# Patient Record
Sex: Female | Born: 1965 | Race: White | Hispanic: No | Smoking: Never smoker
Health system: Southern US, Community
[De-identification: ages and names within clinical notes are randomized; demographics above are authoritative.]

## PROBLEM LIST (undated history)

## (undated) DIAGNOSIS — D649 Anemia, unspecified: Secondary | ICD-10-CM

## (undated) DIAGNOSIS — N938 Other specified abnormal uterine and vaginal bleeding: Secondary | ICD-10-CM

## (undated) HISTORY — DX: Other specified abnormal uterine and vaginal bleeding: N93.8

## (undated) HISTORY — DX: Anemia, unspecified: D64.9

---

## 2013-01-16 ENCOUNTER — Other Ambulatory Visit: Payer: BC Managed Care – PPO

## 2013-01-16 DIAGNOSIS — Z0000 Encounter for general adult medical examination without abnormal findings: Secondary | ICD-10-CM

## 2013-01-16 LAB — LIPID PANEL
LDL Cholesterol: 73 mg/dL (ref 0–99)
Triglycerides: 73 mg/dL (ref <150)

## 2013-01-17 ENCOUNTER — Encounter: Payer: Self-pay | Admitting: Family Medicine

## 2013-01-26 ENCOUNTER — Ambulatory Visit (INDEPENDENT_AMBULATORY_CARE_PROVIDER_SITE_OTHER): Payer: BC Managed Care – PPO | Admitting: Family Medicine

## 2013-01-26 ENCOUNTER — Encounter: Payer: Self-pay | Admitting: Family Medicine

## 2013-01-26 VITALS — BP 100/64 | HR 78 | Temp 98.4°F | Resp 16 | Ht 62.0 in | Wt 116.0 lb

## 2013-01-26 DIAGNOSIS — R5383 Other fatigue: Secondary | ICD-10-CM

## 2013-01-26 DIAGNOSIS — R5381 Other malaise: Secondary | ICD-10-CM

## 2013-01-26 DIAGNOSIS — Z13220 Encounter for screening for lipoid disorders: Secondary | ICD-10-CM

## 2013-01-26 DIAGNOSIS — N938 Other specified abnormal uterine and vaginal bleeding: Secondary | ICD-10-CM | POA: Insufficient documentation

## 2013-01-26 DIAGNOSIS — D649 Anemia, unspecified: Secondary | ICD-10-CM | POA: Insufficient documentation

## 2013-01-26 NOTE — Progress Notes (Signed)
Subjective:    Patient ID: Mary Cross, female    DOB: 06/13/1966, 47 y.o.   MRN: [id number]  HPI Patient is here today for cholesterol screening. She denies chest pain shortness of breath or dyspnea on exertion. She needs a cholesterol screening for a form for work. She also complains of some mild fatigue and she like to have her sugar checked. She just drank a beverage was sugar in the beverage within the last hour.   Past Medical History  Diagnosis Date  . Anemia   . DUB (dysfunctional uterine bleeding)    No current outpatient prescriptions on file prior to visit.   No current facility-administered medications on file prior to visit.   No Known Allergies History   Social History  . Marital Status: Married    Spouse Name: N/A    Number of Children: N/A  . Years of Education: N/A   Occupational History  . Not on file.   Social History Main Topics  . Smoking status: Never Smoker   . Smokeless tobacco: Never Used  . Alcohol Use: No  . Drug Use: No  . Sexually Active: Not on file   Other Topics Concern  . Not on file   Social History Narrative  . No narrative on file   she is currently going through a divorce. This is causing a great deal of stress in her life. She is requesting copies of her last office visits for her court proceedings.  I gave the patient copies of her last 3 office visit.    Review of Systems  All other systems reviewed and are negative.       Objective:   Physical Exam  Vitals reviewed. Constitutional: She is oriented to person, place, and time. She appears well-developed and well-nourished. No distress.  HENT:  Head: Normocephalic and atraumatic.  Eyes: Conjunctivae are normal. Pupils are equal, round, and reactive to light. No scleral icterus.  Cardiovascular: Normal rate and regular rhythm.   Pulmonary/Chest: Effort normal and breath sounds normal.  Neurological: She is alert and oriented to person, place, and time.  Skin: She is  not diaphoretic.  Psychiatric: She has a normal mood and affect. Her behavior is normal. Judgment and thought content normal.          Assessment & Plan:  1. Other malaise and fatigue Random blood sugar is 111 which is normal given the fact she just ate. I reassured her that her sugar is normal. - Glucose, fingerstick (stat)  2. Screening for cholesterol level Cholesterol panel as listed below and her readings are excellent. No therapy is necessary. Lab on 01/16/2013  Component Date Value Range Status  . Cholesterol 01/16/2013 163  0 - 200 mg/dL Final   Comment: ATP III Classification:                                < 200        mg/dL        Desirable                               200 - 239     mg/dL        Borderline High                               >=  240        mg/dL        High                             . Triglycerides 01/16/2013 73  <150 mg/dL Final  . HDL 09/32/3557 75  >39 mg/dL Final  . Total CHOL/HDL Ratio 01/16/2013 2.2   Final  . VLDL 01/16/2013 15  0 - 40 mg/dL Final  . LDL Cholesterol 01/16/2013 73  0 - 99 mg/dL Final   Comment:                            Total Cholesterol/HDL Ratio:CHD Risk                                                 Coronary Heart Disease Risk Table                                                                 Men       Women                                   1/2 Average Risk              3.4        3.3                                       Average Risk              5.0        4.4                                    2X Average Risk              9.6        7.1                                    3X Average Risk             23.4       11.0                          Use the calculated Patient Ratio above and the CHD Risk table                           to determine the patient's CHD Risk.                          ATP III Classification (LDL):                                <  100        mg/dL         Optimal                               100  - 129     mg/dL         Near or Above Optimal                               130 - 159     mg/dL         Borderline High                               160 - 189     mg/dL         High                                > 190        mg/dL         Very High

## 2013-02-14 LAB — GLUCOSE, FINGERSTICK (STAT): Glucose, fingerstick: 111 mg/dL — ABNORMAL HIGH (ref 70–99)

## 2013-03-14 ENCOUNTER — Telehealth: Payer: Self-pay | Admitting: Family Medicine

## 2013-03-14 NOTE — Telephone Encounter (Signed)
Ortho cyclen 1/35 4 on day 1, 3 on day 2, 2 on day 3, 1 on day 4  Pt last got this 11/19/11.Mary Kitchenshe is going through a divorce and child custody and she is very stressed. Last time she got this she had just gotten separated and was under a lot of stress, she is soaking through pads and tampons, she is very tired because of bleeding so much, she said that this only happens when she is stressed

## 2013-03-15 NOTE — Telephone Encounter (Signed)
She needs to stay on  1 pill a day afterwards to prevent this.  Please warn about nausea and the risk of DVT from high dose estrogen.

## 2013-03-15 NOTE — Telephone Encounter (Signed)
ok 

## 2013-03-15 NOTE — Telephone Encounter (Signed)
LMTRC

## 2013-03-15 NOTE — Telephone Encounter (Signed)
?   OK to Refill  

## 2013-04-05 NOTE — Telephone Encounter (Signed)
No return call 

## 2013-09-03 ENCOUNTER — Encounter: Payer: BC Managed Care – PPO | Admitting: Family Medicine

## 2013-09-03 ENCOUNTER — Encounter: Payer: Self-pay | Admitting: Family Medicine

## 2013-09-03 NOTE — Progress Notes (Signed)
  This encounter was created in error - please disregard. Patient wanted to discuss her child's pregnancy. She wants an abortion. I recommended that they contact [facility] for further information.  Be glad to schedule a GYN/OB appointment if they so desire

## 2014-01-11 ENCOUNTER — Other Ambulatory Visit: Payer: Self-pay | Admitting: Family Medicine

## 2014-01-11 ENCOUNTER — Other Ambulatory Visit: Payer: BC Managed Care – PPO

## 2014-01-11 DIAGNOSIS — E785 Hyperlipidemia, unspecified: Secondary | ICD-10-CM

## 2014-01-11 DIAGNOSIS — D6489 Other specified anemias: Secondary | ICD-10-CM

## 2014-01-11 LAB — COMPLETE METABOLIC PANEL WITH GFR
ALBUMIN: 3.7 g/dL (ref 3.5–5.2)
ALT: 14 U/L (ref 0–35)
AST: 17 U/L (ref 0–37)
Alkaline Phosphatase: 41 U/L (ref 39–117)
BUN: 11 mg/dL (ref 6–23)
CALCIUM: 8.8 mg/dL (ref 8.4–10.5)
CHLORIDE: 108 meq/L (ref 96–112)
CO2: 24 meq/L (ref 19–32)
Creat: 0.66 mg/dL (ref 0.50–1.10)
GLUCOSE: 80 mg/dL (ref 70–99)
POTASSIUM: 4.5 meq/L (ref 3.5–5.3)
SODIUM: 140 meq/L (ref 135–145)
TOTAL PROTEIN: 6.1 g/dL (ref 6.0–8.3)
Total Bilirubin: 0.3 mg/dL (ref 0.2–1.2)

## 2014-01-11 LAB — CBC WITH DIFFERENTIAL/PLATELET
Basophils Absolute: 0.1 10*3/uL (ref 0.0–0.1)
Basophils Relative: 1 % (ref 0–1)
EOS ABS: 0.1 10*3/uL (ref 0.0–0.7)
Eosinophils Relative: 1 % (ref 0–5)
HCT: 27.8 % — ABNORMAL LOW (ref 36.0–46.0)
HEMOGLOBIN: 8.5 g/dL — AB (ref 12.0–15.0)
LYMPHS ABS: 1.8 10*3/uL (ref 0.7–4.0)
LYMPHS PCT: 21 % (ref 12–46)
MCH: 21.6 pg — AB (ref 26.0–34.0)
MCHC: 30.2 g/dL (ref 30.0–36.0)
MCV: 70.6 fL — ABNORMAL LOW (ref 78.0–100.0)
MONOS PCT: 9 % (ref 3–12)
Monocytes Absolute: 0.8 10*3/uL (ref 0.1–1.0)
NEUTROS ABS: 5.7 10*3/uL (ref 1.7–7.7)
NEUTROS PCT: 68 % (ref 43–77)
PLATELETS: 392 10*3/uL (ref 150–400)
RBC: 3.94 MIL/uL (ref 3.87–5.11)
RDW: 18.8 % — ABNORMAL HIGH (ref 11.5–15.5)
WBC: 8.4 10*3/uL (ref 4.0–10.5)

## 2014-01-11 LAB — LIPID PANEL
CHOLESTEROL: 123 mg/dL (ref 0–200)
HDL: 62 mg/dL (ref >39)
LDL Cholesterol: 53 mg/dL (ref 0–99)
Total CHOL/HDL Ratio: 2 Ratio
Triglycerides: 40 mg/dL (ref <150)
VLDL: 8 mg/dL (ref 0–40)

## 2014-01-14 LAB — IRON

## 2014-01-14 LAB — VITAMIN B12: VITAMIN B 12: 458 pg/mL (ref 211–911)

## 2014-01-14 LAB — FERRITIN: FERRITIN: 2 ng/mL — AB (ref 10–291)

## 2014-01-14 LAB — FOLATE: FOLATE: 9 ng/mL

## 2014-01-16 ENCOUNTER — Other Ambulatory Visit: Payer: BC Managed Care – PPO

## 2014-01-16 DIAGNOSIS — D649 Anemia, unspecified: Secondary | ICD-10-CM

## 2014-01-17 LAB — FECAL OCCULT BLOOD, IMMUNOCHEMICAL
FECAL OCCULT BLOOD: NEGATIVE
Fecal Occult Blood: NEGATIVE
Fecal Occult Blood: NEGATIVE

## 2014-01-18 ENCOUNTER — Encounter: Payer: Self-pay | Admitting: Family Medicine

## 2014-01-18 ENCOUNTER — Ambulatory Visit (INDEPENDENT_AMBULATORY_CARE_PROVIDER_SITE_OTHER): Payer: BC Managed Care – PPO | Admitting: Family Medicine

## 2014-01-18 VITALS — BP 118/64 | HR 94 | Temp 99.2°F | Resp 14 | Ht 62.0 in | Wt 123.0 lb

## 2014-01-18 DIAGNOSIS — D509 Iron deficiency anemia, unspecified: Secondary | ICD-10-CM

## 2014-01-18 NOTE — Progress Notes (Signed)
Subjective:    Patient ID: Mary Cross, female    DOB: [birth year], 48 y.o.   MRN: [id number]  HPI Patient been complaining of fatigue recently. Her most recent lab work revealed severe anemia with a hemoglobin of 8.5. She was found to have extremely low iron and ferritin. Her folate and B12 are normal. Her stool cards were negative for blood x3. Patient has a history of dysfunctional uterine bleeding. She has only had 3 episodes of severe menorrhagia.  However she does have a heavy period every month. It typically last 5 days. She soaks through a heavy pad every 4 hours. She is unwilling to take birth control to regulate her menstrual cycles. She is also not been taking any kind of iron due to her inability to tolerate oral iron. Most recent labwork as listed below: Lab on 01/16/2014  Component Date Value Ref Range Status  . Fecal Occult Blood 01/16/2014 NEG  Negative Final  . Fecal Occult Blood 01/16/2014 NEG  Negative Final  . Fecal Occult Blood 01/16/2014 NEG  Negative Final  Lab on 01/11/2014  Component Date Value Ref Range Status  . Cholesterol 01/11/2014 123  0 - 200 mg/dL Final   Comment: ATP III Classification:                                < 200        mg/dL        Desirable                               200 - 239     mg/dL        Borderline High                               >= 240        mg/dL        High                             . Triglycerides 01/11/2014 40  <150 mg/dL Final  . HDL 01/11/2014 62  >39 mg/dL Final  . Total CHOL/HDL Ratio 01/11/2014 2.0   Final  . VLDL 01/11/2014 8  0 - 40 mg/dL Final  . LDL Cholesterol 01/11/2014 53  0 - 99 mg/dL Final   Comment:                            Total Cholesterol/HDL Ratio:CHD Risk                                                 Coronary Heart Disease Risk Table                                                                 Men       Women  1/2 Average Risk              3.4        3.3                                    Average Risk              5.0        4.4                                    2X Average Risk              9.6        7.1                                    3X Average Risk             23.4       11.0                          Use the calculated Patient Ratio above and the CHD Risk table                           to determine the patient's CHD Risk.                          ATP III Classification (LDL):                                < 100        mg/dL         Optimal                               100 - 129     mg/dL         Near or Above Optimal                               130 - 159     mg/dL         Borderline High                               160 - 189     mg/dL         High                                > 190        mg/dL         Very High                             . WBC 01/11/2014 8.4  4.0 - 10.5 K/uL Final  . RBC 01/11/2014 3.94  3.87 - 5.11 MIL/uL Final  . Hemoglobin 01/11/2014 8.5* 12.0 -  15.0 g/dL Final   Result repeated and verified.  Marland Kitchen HCT 01/11/2014 27.8* 36.0 - 46.0 % Final  . MCV 01/11/2014 70.6* 78.0 - 100.0 fL Final  . MCH 01/11/2014 21.6* 26.0 - 34.0 pg Final  . MCHC 01/11/2014 30.2  30.0 - 36.0 g/dL Final  . RDW 01/11/2014 18.8* 11.5 - 15.5 % Final  . Platelets 01/11/2014 392  150 - 400 K/uL Final  . Neutrophils Relative % 01/11/2014 68  43 - 77 % Final  . Neutro Abs 01/11/2014 5.7  1.7 - 7.7 K/uL Final  . Lymphocytes Relative 01/11/2014 21  12 - 46 % Final  . Lymphs Abs 01/11/2014 1.8  0.7 - 4.0 K/uL Final  . Monocytes Relative 01/11/2014 9  3 - 12 % Final  . Monocytes Absolute 01/11/2014 0.8  0.1 - 1.0 K/uL Final  . Eosinophils Relative 01/11/2014 1  0 - 5 % Final  . Eosinophils Absolute 01/11/2014 0.1  0.0 - 0.7 K/uL Final  . Basophils Relative 01/11/2014 1  0 - 1 % Final  . Basophils Absolute 01/11/2014 0.1  0.0 - 0.1 K/uL Final  . Smear Review 01/11/2014 Criteria for review not met   Final  . Sodium 01/11/2014 140  135 - 145  mEq/L Final  . Potassium 01/11/2014 4.5  3.5 - 5.3 mEq/L Final  . Chloride 01/11/2014 108  96 - 112 mEq/L Final  . CO2 01/11/2014 24  19 - 32 mEq/L Final  . Glucose, Bld 01/11/2014 80  70 - 99 mg/dL Final  . BUN 01/11/2014 11  6 - 23 mg/dL Final  . Creat 01/11/2014 0.66  0.50 - 1.10 mg/dL Final  . Total Bilirubin 01/11/2014 0.3  0.2 - 1.2 mg/dL Final  . Alkaline Phosphatase 01/11/2014 41  39 - 117 U/L Final  . AST 01/11/2014 17  0 - 37 U/L Final  . ALT 01/11/2014 14  0 - 35 U/L Final  . Total Protein 01/11/2014 6.1  6.0 - 8.3 g/dL Final  . Albumin 01/11/2014 3.7  3.5 - 5.2 g/dL Final  . Calcium 01/11/2014 8.8  8.4 - 10.5 mg/dL Final  . GFR, Est African American 01/11/2014 >89   Final  . GFR, Est Non African American 01/11/2014 >89   Final   Comment:                            The estimated GFR is a calculation valid for adults (>=23 years old)                          that uses the CKD-EPI algorithm to adjust for age and sex. It is                            not to be used for children, pregnant women, hospitalized patients,                             patients on dialysis, or with rapidly changing kidney function.                          According to the NKDEP, eGFR >89 is normal, 60-89 shows mild  impairment, 30-59 shows moderate impairment, 15-29 shows severe                          impairment and <15 is ESRD.                             Orders Only on 01/11/2014  Component Date Value Ref Range Status  . Iron 01/11/2014 <10* 42 - 145 ug/dL Final  . Vitamin B-12 01/11/2014 458  211 - 911 pg/mL Final  . Folate 01/11/2014 9.0   Final   Comment:                            Reference Ranges                                  Deficient:       0.4 - 3.3 ng/mL                                  Indeterminate:   3.4 - 5.4 ng/mL                                  Normal:              > 5.4 ng/mL                             . Ferritin 01/11/2014 2* 10 - 291 ng/mL Final     Past Medical History  Diagnosis Date  . Anemia   . DUB (dysfunctional uterine bleeding)    No current outpatient prescriptions on file prior to visit.   No current facility-administered medications on file prior to visit.   No Known Allergies History   Social History  . Marital Status: Married    Spouse Name: N/A    Number of Children: N/A  . Years of Education: N/A   Occupational History  . Not on file.   Social History Main Topics  . Smoking status: Never Smoker   . Smokeless tobacco: Never Used  . Alcohol Use: No  . Drug Use: No  . Sexual Activity: Not on file   Other Topics Concern  . Not on file   Social History Narrative  . No narrative on file      Review of Systems  All other systems reviewed and are negative.      Objective:   Physical Exam  Vitals reviewed. Cardiovascular: Normal rate, regular rhythm and normal heart sounds.  Exam reveals no gallop and no friction rub.   No murmur heard. Pulmonary/Chest: Effort normal and breath sounds normal. No respiratory distress. She has no wheezes.  Abdominal: Soft. Bowel sounds are normal.          Assessment & Plan:  1. Anemia, iron deficiency I believe her anemia is due to iron deficiency Copper with menorrhagia. I have recommended Fusion Plus with 130 mg of elemental iron poqday and return in one month for a repeat CBC and iron level. Iron stores are not improving and her hemoglobin is not rising, I would recommend consultation with a hematologist regarding possible IV  iron. I also recommended referral to gynecologist discussed possible IUD placement to regulate her menorrhagia but the patient declined this.

## 2014-10-03 ENCOUNTER — Telehealth: Payer: Self-pay | Admitting: Family Medicine

## 2014-10-03 MED ORDER — NORGESTIMATE-ETH ESTRADIOL 0.25-35 MG-MCG PO TABS: 1.0000 | ORAL_TABLET | Freq: Every day | ORAL | Status: DC

## 2014-10-03 NOTE — Telephone Encounter (Signed)
Per [facility] will give 1 month of BCP's but pt has to have an appt with GYN  - pt states that she will call her dtrs gyn and set up appt to see him and she will come in to have BW done as well. 1 month of BPC sent to pharm

## 2014-10-03 NOTE — Telephone Encounter (Signed)
Please see last ov (7/15).  Patient overdue to recheck CBC on iron.  I recommend GYN referral.  This has gone on too long and given her sporadic follow up, I feel they need to perform endometrial biopsy and US to rule out cancer.

## 2014-10-03 NOTE — Telephone Encounter (Signed)
[facility]  Patient is calling to say that she is having very bad periods and has discussed this with dr Tanya Nonespickard, would like a call back as to what she needs to do or what she can take  [phone]

## 2014-10-17 ENCOUNTER — Other Ambulatory Visit: Payer: BLUE CROSS/BLUE SHIELD

## 2014-10-17 ENCOUNTER — Ambulatory Visit: Payer: BLUE CROSS/BLUE SHIELD | Admitting: *Deleted

## 2014-10-17 VITALS — BP 100/70

## 2014-10-17 DIAGNOSIS — D509 Iron deficiency anemia, unspecified: Secondary | ICD-10-CM

## 2014-10-17 DIAGNOSIS — Z0130 Encounter for examination of blood pressure without abnormal findings: Secondary | ICD-10-CM

## 2014-10-18 ENCOUNTER — Telehealth: Payer: Self-pay | Admitting: *Deleted

## 2014-10-18 LAB — CBC WITH DIFFERENTIAL/PLATELET
BASOS PCT: 0 % (ref 0–1)
Basophils Absolute: 0 10*3/uL (ref 0.0–0.1)
Eosinophils Absolute: 0.1 10*3/uL (ref 0.0–0.7)
Eosinophils Relative: 1 % (ref 0–5)
HCT: 27.9 % — ABNORMAL LOW (ref 36.0–46.0)
HEMOGLOBIN: 8.4 g/dL — AB (ref 12.0–15.0)
Lymphocytes Relative: 15 % (ref 12–46)
Lymphs Abs: 2.1 10*3/uL (ref 0.7–4.0)
MCH: 22.4 pg — ABNORMAL LOW (ref 26.0–34.0)
MCHC: 30.1 g/dL (ref 30.0–36.0)
MCV: 74.4 fL — ABNORMAL LOW (ref 78.0–100.0)
MONO ABS: 1.4 10*3/uL — AB (ref 0.1–1.0)
MONOS PCT: 10 % (ref 3–12)
MPV: 8.7 fL (ref 8.6–12.4)
NEUTROS ABS: 10.4 10*3/uL — AB (ref 1.7–7.7)
Neutrophils Relative %: 74 % (ref 43–77)
Platelets: 481 10*3/uL — ABNORMAL HIGH (ref 150–400)
RBC: 3.75 MIL/uL — AB (ref 3.87–5.11)
RDW: 17.8 % — ABNORMAL HIGH (ref 11.5–15.5)
WBC: 14 10*3/uL — ABNORMAL HIGH (ref 4.0–10.5)

## 2014-10-18 LAB — IBC PANEL
%SAT: 3 % — ABNORMAL LOW (ref 20–55)
TIBC: 486 ug/dL — AB (ref 250–470)
UIBC: 471 ug/dL — ABNORMAL HIGH (ref 125–400)

## 2014-10-18 LAB — FERRITIN: Ferritin: 8 ng/mL — ABNORMAL LOW (ref 10–291)

## 2014-10-18 LAB — IRON: IRON: 15 ug/dL — AB (ref 42–145)

## 2014-10-18 NOTE — Telephone Encounter (Signed)
Received a call from SchlusserAllison at [facility] with a critical lab of Hgb of 8.4 low, this was repeated and verified

## 2014-10-28 ENCOUNTER — Other Ambulatory Visit (HOSPITAL_COMMUNITY)
Admission: RE | Admit: 2014-10-28 | Discharge: 2014-10-28 | Disposition: A | Discharge status: Home / Self Care | Payer: BLUE CROSS/BLUE SHIELD | Source: Ambulatory Visit | Attending: Obstetrics & Gynecology | Admitting: Obstetrics & Gynecology

## 2014-10-28 ENCOUNTER — Other Ambulatory Visit: Payer: Self-pay | Admitting: Obstetrics & Gynecology

## 2014-10-28 DIAGNOSIS — Z1151 Encounter for screening for human papillomavirus (HPV): Secondary | ICD-10-CM | POA: Diagnosis present

## 2014-10-28 DIAGNOSIS — Z01419 Encounter for gynecological examination (general) (routine) without abnormal findings: Secondary | ICD-10-CM | POA: Diagnosis not present

## 2014-10-30 LAB — CYTOLOGY - PAP

## 2018-05-19 ENCOUNTER — Other Ambulatory Visit: Payer: Self-pay

## 2018-05-19 ENCOUNTER — Ambulatory Visit: Payer: BLUE CROSS/BLUE SHIELD | Admitting: Family Medicine

## 2018-05-19 ENCOUNTER — Ambulatory Visit (INDEPENDENT_AMBULATORY_CARE_PROVIDER_SITE_OTHER): Payer: BLUE CROSS/BLUE SHIELD | Admitting: Family Medicine

## 2018-05-19 ENCOUNTER — Encounter: Payer: Self-pay | Admitting: Family Medicine

## 2018-05-19 VITALS — BP 114/62 | HR 82 | Temp 98.4°F | Resp 14 | Ht 62.0 in | Wt 136.0 lb

## 2018-05-19 DIAGNOSIS — F418 Other specified anxiety disorders: Secondary | ICD-10-CM | POA: Diagnosis not present

## 2018-05-19 DIAGNOSIS — Z566 Other physical and mental strain related to work: Secondary | ICD-10-CM

## 2018-05-19 DIAGNOSIS — R079 Chest pain, unspecified: Secondary | ICD-10-CM

## 2018-05-19 NOTE — Patient Instructions (Signed)
F/u pending results 
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
## 2018-05-20 LAB — COMPREHENSIVE METABOLIC PANEL
AG Ratio: 2 (calc) (ref 1.0–2.5)
ALT: 15 U/L (ref 6–29)
AST: 17 U/L (ref 10–35)
Albumin: 4.7 g/dL (ref 3.6–5.1)
Alkaline phosphatase (APISO): 57 U/L (ref 33–130)
BUN: 12 mg/dL (ref 7–25)
CALCIUM: 10.2 mg/dL (ref 8.6–10.4)
CO2: 28 mmol/L (ref 20–32)
Chloride: 101 mmol/L (ref 98–110)
Creat: 0.9 mg/dL (ref 0.50–1.05)
GLUCOSE: 92 mg/dL (ref 65–99)
Globulin: 2.3 g/dL (calc) (ref 1.9–3.7)
Potassium: 3.6 mmol/L (ref 3.5–5.3)
SODIUM: 140 mmol/L (ref 135–146)
TOTAL PROTEIN: 7 g/dL (ref 6.1–8.1)
Total Bilirubin: 0.8 mg/dL (ref 0.2–1.2)

## 2018-05-20 LAB — CBC WITH DIFFERENTIAL/PLATELET
BASOS PCT: 0.4 %
Basophils Absolute: 53 cells/uL (ref 0–200)
Eosinophils Absolute: 145 cells/uL (ref 15–500)
Eosinophils Relative: 1.1 %
HCT: 42 % (ref 35.0–45.0)
Hemoglobin: 14.4 g/dL (ref 11.7–15.5)
Lymphs Abs: 2600 cells/uL (ref 850–3900)
MCH: 31.2 pg (ref 27.0–33.0)
MCHC: 34.3 g/dL (ref 32.0–36.0)
MCV: 90.9 fL (ref 80.0–100.0)
MPV: 9.2 fL (ref 7.5–12.5)
Monocytes Relative: 6.7 %
Neutro Abs: 9517 cells/uL — ABNORMAL HIGH (ref 1500–7800)
Neutrophils Relative %: 72.1 %
Platelets: 407 10*3/uL — ABNORMAL HIGH (ref 140–400)
RBC: 4.62 10*6/uL (ref 3.80–5.10)
RDW: 11.7 % (ref 11.0–15.0)
TOTAL LYMPHOCYTE: 19.7 %
WBC mixed population: 884 cells/uL (ref 200–950)
WBC: 13.2 10*3/uL — AB (ref 3.8–10.8)

## 2018-05-20 LAB — TSH: TSH: 2.08 mIU/L

## 2018-05-21 ENCOUNTER — Encounter: Payer: Self-pay | Admitting: Family Medicine

## 2018-06-08 ENCOUNTER — Encounter: Payer: Self-pay | Admitting: *Deleted

## 2018-06-08 ENCOUNTER — Other Ambulatory Visit: Payer: Self-pay | Admitting: *Deleted

## 2018-06-08 DIAGNOSIS — D72829 Elevated white blood cell count, unspecified: Secondary | ICD-10-CM

## 2018-06-13 ENCOUNTER — Telehealth: Payer: Self-pay | Admitting: *Deleted

## 2018-06-13 NOTE — Telephone Encounter (Signed)
I dont think I charged new pt though it had been > 3 years since she had been seen It was a long complex appointment with testing though

## 2018-06-13 NOTE — Telephone Encounter (Signed)
Received call from patient.   Reports that she believes she was overcharged for her appointment on 05/19/2018. States that she was billed as a new patient though she has been seen here previously.   Also states that she was recently terminated and cannot pay $350 for a new patient appointment.   Please advise.

## 2018-06-15 NOTE — Telephone Encounter (Signed)
I have looked under patients guarantor snapshot she was charged a level 5 office visit and the amount that is left over is the patients deductible. I have called and left patient a message to call me back.   CB# [phone]

## 2018-06-15 NOTE — Telephone Encounter (Signed)
Patient returned my call and I went over the charges with patient. She asked if I could please write off this bill I explained to patient that unfortunately I can't write it off since her insurance company is stating it is her responsibility and if I wrote it off they would consider that fraudulent. I advised patient that I would mark her account as on a payment plan so it wouldn't go to collections.   She did tell me about the stress she was dealing with from being fired at work, a recent divorce after 25 years and being a single mother to 4 children.

## 2019-07-31 ENCOUNTER — Telehealth (INDEPENDENT_AMBULATORY_CARE_PROVIDER_SITE_OTHER): Payer: BC Managed Care – PPO | Admitting: Family Medicine

## 2019-07-31 DIAGNOSIS — W5501XA Bitten by cat, initial encounter: Secondary | ICD-10-CM | POA: Diagnosis not present

## 2019-07-31 DIAGNOSIS — L03114 Cellulitis of left upper limb: Secondary | ICD-10-CM

## 2019-07-31 MED ORDER — FLUCONAZOLE 150 MG PO TABS: 150.0000 mg | ORAL_TABLET | Freq: Once | ORAL | 0 refills | Status: AC

## 2019-07-31 MED ORDER — AMOXICILLIN-POT CLAVULANATE 875-125 MG PO TABS: 1.0000 | ORAL_TABLET | Freq: Two times a day (BID) | ORAL | 0 refills | Status: DC

## 2019-07-31 NOTE — Progress Notes (Signed)
Subjective:    Patient ID: Mary Cross, female    DOB: [birth year], 54 y.o.   MRN: [id number]  HPI  Patient is being seen today as a video visit.  Patient consents to be seen as a video visit.  Phone call began at 410.  Phone call concluded at 419.  Patient states that yesterday she was bitten by her own cat on the left forearm.  Through the video screen I can see over the lateral dorsal left forearm that the skin in that area is erythematous and swollen.  She states that it is warm and tender to the touch.  She is not running a fever.  There is no streaking erythema however an area approximately 4 cm in diameter around the puncture wound is erythematous and warm and tender.  She appears to be developing a cellulitis after the cat bite.  It is also been more than 10 years since her last tetanus shot.  She denies any pain with range of motion of her fingers.  She is able to flex and extend her wrist without pain.  She is able to flex and extend her elbow without pain. Past Medical History:  Diagnosis Date  . Anemia   . DUB (dysfunctional uterine bleeding)    No past surgical history on file. Current Outpatient Medications on File Prior to Visit  Medication Sig Dispense Refill  . levonorgestrel (MIRENA) 20 MCG/24HR IUD 1 each by Intrauterine route once.     No current facility-administered medications on file prior to visit.   No Known Allergies Social History   Socioeconomic History  . Marital status: Divorced    Spouse name: Not on file  . Number of children: 4  . Years of education: Not on file  . Highest education level: High school graduate  Occupational History    Employer: [employer]  Tobacco Use  . Smoking status: Never Smoker  . Smokeless tobacco: Never Used  Substance and Sexual Activity  . Alcohol use: No    Comment: occ  . Drug use: No  . Sexual activity: Not Currently  Other Topics Concern  . Not on file  Social History Narrative  . Not on file   Social  Determinants of Health   Financial Resource Strain:   . Difficulty of Paying Living Expenses: Not on file  Food Insecurity:   . Worried About Charity fundraiser in the Last Year: Not on file  . Ran Out of Food in the Last Year: Not on file  Transportation Needs:   . Lack of Transportation (Medical): Not on file  . Lack of Transportation (Non-Medical): Not on file  Physical Activity:   . Days of Exercise per Week: Not on file  . Minutes of Exercise per Session: Not on file  Stress:   . Feeling of Stress : Not on file  Social Connections:   . Frequency of Communication with Friends and Family: Not on file  . Frequency of Social Gatherings with Friends and Family: Not on file  . Attends Religious Services: Not on file  . Active Member of Clubs or Organizations: Not on file  . Attends Archivist Meetings: Not on file  . Marital Status: Not on file  Intimate Partner Violence:   . Fear of Current or Ex-Partner: Not on file  . Emotionally Abused: Not on file  . Physically Abused: Not on file  . Sexually Abused: Not on file     Review of Systems  All other systems reviewed and are negative.      Objective:   Physical Exam Musculoskeletal:     Left forearm: Swelling and tenderness present.       Arms:           Assessment & Plan:  Cat bite, initial encounter  Cellulitis of left arm  I have recommended that the patient come in as soon as possible to get a tetanus shot.  Meanwhile start the patient on Augmentin 875 mg p.o. twice daily for 10 days to cover the cellulitis as well as Pasteurella multocida.  Recommended warm compresses be applied to the area to help with swelling and pain.  Gave the patient warning signs for compartment syndrome or secondary abscess.  Reassess in 48 hours if no better or sooner if worse.  Did give the patient a prescription for Diflucan in case she develops a secondary yeast infection.

## 2019-08-02 ENCOUNTER — Ambulatory Visit (INDEPENDENT_AMBULATORY_CARE_PROVIDER_SITE_OTHER): Payer: BC Managed Care – PPO | Admitting: *Deleted

## 2019-08-02 ENCOUNTER — Other Ambulatory Visit: Payer: Self-pay

## 2019-08-02 DIAGNOSIS — W5501XD Bitten by cat, subsequent encounter: Secondary | ICD-10-CM | POA: Diagnosis not present

## 2019-08-02 DIAGNOSIS — Z23 Encounter for immunization: Secondary | ICD-10-CM | POA: Diagnosis not present

## 2020-01-01 DIAGNOSIS — Z01419 Encounter for gynecological examination (general) (routine) without abnormal findings: Secondary | ICD-10-CM | POA: Diagnosis not present

## 2020-11-07 ENCOUNTER — Other Ambulatory Visit: Payer: Managed Care, Other (non HMO)

## 2020-11-07 ENCOUNTER — Other Ambulatory Visit: Payer: Self-pay

## 2020-11-07 DIAGNOSIS — Z136 Encounter for screening for cardiovascular disorders: Secondary | ICD-10-CM

## 2020-11-07 DIAGNOSIS — Z13220 Encounter for screening for lipoid disorders: Secondary | ICD-10-CM

## 2020-11-07 DIAGNOSIS — Z0000 Encounter for general adult medical examination without abnormal findings: Secondary | ICD-10-CM

## 2020-11-07 DIAGNOSIS — Z1329 Encounter for screening for other suspected endocrine disorder: Secondary | ICD-10-CM

## 2020-11-08 LAB — COMPLETE METABOLIC PANEL WITH GFR
AG Ratio: 2 (calc) (ref 1.0–2.5)
ALT: 21 U/L (ref 6–29)
AST: 19 U/L (ref 10–35)
Albumin: 4.4 g/dL (ref 3.6–5.1)
Alkaline phosphatase (APISO): 62 U/L (ref 37–153)
BUN: 18 mg/dL (ref 7–25)
CO2: 27 mmol/L (ref 20–32)
Calcium: 9.7 mg/dL (ref 8.6–10.4)
Chloride: 105 mmol/L (ref 98–110)
Creat: 0.88 mg/dL (ref 0.50–1.05)
GFR, Est African American: 87 mL/min/{1.73_m2} (ref >=60)
GFR, Est Non African American: 75 mL/min/{1.73_m2} (ref >=60)
Globulin: 2.2 g/dL (calc) (ref 1.9–3.7)
Glucose, Bld: 95 mg/dL (ref 65–99)
Potassium: 4.2 mmol/L (ref 3.5–5.3)
Sodium: 140 mmol/L (ref 135–146)
Total Bilirubin: 0.4 mg/dL (ref 0.2–1.2)
Total Protein: 6.6 g/dL (ref 6.1–8.1)

## 2020-11-08 LAB — CBC WITH DIFFERENTIAL/PLATELET
Absolute Monocytes: 681 cells/uL (ref 200–950)
Basophils Absolute: 49 cells/uL (ref 0–200)
Basophils Relative: 0.6 %
Eosinophils Absolute: 164 cells/uL (ref 15–500)
Eosinophils Relative: 2 %
HCT: 41.1 % (ref 35.0–45.0)
Hemoglobin: 13.5 g/dL (ref 11.7–15.5)
Lymphs Abs: 1624 cells/uL (ref 850–3900)
MCH: 30.4 pg (ref 27.0–33.0)
MCHC: 32.8 g/dL (ref 32.0–36.0)
MCV: 92.6 fL (ref 80.0–100.0)
MPV: 9.2 fL (ref 7.5–12.5)
Monocytes Relative: 8.3 %
Neutro Abs: 5683 cells/uL (ref 1500–7800)
Neutrophils Relative %: 69.3 %
Platelets: 328 10*3/uL (ref 140–400)
RBC: 4.44 10*6/uL (ref 3.80–5.10)
RDW: 11.8 % (ref 11.0–15.0)
Total Lymphocyte: 19.8 %
WBC: 8.2 10*3/uL (ref 3.8–10.8)

## 2020-11-08 LAB — LIPID PANEL
Cholesterol: 158 mg/dL (ref <200)
HDL: 64 mg/dL (ref >=50)
LDL Cholesterol (Calc): 78 mg/dL (calc)
Non-HDL Cholesterol (Calc): 94 mg/dL (calc) (ref <130)
Total CHOL/HDL Ratio: 2.5 (calc) (ref <5.0)
Triglycerides: 79 mg/dL (ref <150)

## 2020-11-08 LAB — TSH: TSH: 4.19 mIU/L

## 2020-11-17 ENCOUNTER — Telehealth: Payer: Self-pay | Admitting: *Deleted

## 2020-11-17 ENCOUNTER — Encounter: Payer: Self-pay | Admitting: Family Medicine

## 2020-11-17 ENCOUNTER — Other Ambulatory Visit: Payer: Self-pay

## 2020-11-17 ENCOUNTER — Ambulatory Visit (INDEPENDENT_AMBULATORY_CARE_PROVIDER_SITE_OTHER): Payer: Managed Care, Other (non HMO) | Admitting: Family Medicine

## 2020-11-17 VITALS — BP 102/68 | HR 88 | Temp 98.0°F | Resp 14 | Ht 63.0 in | Wt 134.0 lb

## 2020-11-17 DIAGNOSIS — Z0000 Encounter for general adult medical examination without abnormal findings: Secondary | ICD-10-CM

## 2020-11-17 NOTE — Telephone Encounter (Signed)
Received verbal orders for Cologuard.   Order placed via [employer].   Cologuard (Order [id number])

## 2020-11-17 NOTE — Telephone Encounter (Signed)
-----   Message from Donita Brooks, MD sent at 11/17/2020  3:11 PM EDT ----- Please schedule for cologuard

## 2020-11-17 NOTE — Progress Notes (Signed)
Subjective:    Patient ID: Mary Cross, female    DOB: [birth year], 55 y.o.   MRN: [id number]  HPI Patient is a very pleasant 55 year old Caucasian female here today for complete physical exam.  She has never had a mammogram.  She has never had a colonoscopy.  Her last Pap smear was performed in 2021.  Therefore her cervical cancer screening is up-to-date.  She does not want me to schedule her for mammogram.  She declines a mammogram today.  I explained to her that this is the most accurate way to detect breast cancer at an early stage however she continues to decline the mammogram today.  She is also hesitant to schedule a colonoscopy.  We discussed Cologuard and after discussing Cologuard, the patient is willing to consider the Cologuard.  I recommended a COVID-vaccine which she declined.  Also recommended the shingles vaccine.  I reviewed her lab work as listed below: Lab on 11/07/2020  Component Date Value Ref Range Status  . Cholesterol 11/07/2020 158  <200 mg/dL Final  . HDL 85/63/1497 64  > OR = 50 mg/dL Final  . Triglycerides 11/07/2020 79  <150 mg/dL Final  . LDL Cholesterol (Calc) 11/07/2020 78  mg/dL (calc) Final   Comment: Reference range: <100 . Desirable range <100 mg/dL for primary prevention;   <70 mg/dL for patients with CHD or diabetic patients  with > or = 2 CHD risk factors. Marland Kitchen LDL-C is now calculated using the Martin-Hopkins  calculation, which is a validated novel method providing  better accuracy than the Friedewald equation in the  estimation of LDL-C.  Horald Pollen et al. Lenox Ahr. 0263;785(88): 2061-2068  (http://education.[facility].com/faq/FAQ164)   . Total CHOL/HDL Ratio 11/07/2020 2.5  <5.0 (calc) Final  . Non-HDL Cholesterol (Calc) 11/07/2020 94  <130 mg/dL (calc) Final   Comment: For patients with diabetes plus 1 major ASCVD risk  factor, treating to a non-HDL-C goal of <100 mg/dL  (LDL-C of <27 mg/dL) is considered a therapeutic  option.   . Glucose,  Bld 11/07/2020 95  65 - 99 mg/dL Final   Comment: .            Fasting reference interval .   . BUN 11/07/2020 18  7 - 25 mg/dL Final  . Creat 74/05/8785 0.88  0.50 - 1.05 mg/dL Final   Comment: For patients >50 years of age, the reference limit for Creatinine is approximately 13% higher for people identified as African-American. .   . GFR, Est Non African American 11/07/2020 75  > OR = 60 mL/min/1.53m2 Final  . GFR, Est African American 11/07/2020 87  > OR = 60 mL/min/1.55m2 Final  . BUN/Creatinine Ratio 11/07/2020 NOT APPLICABLE  6 - 22 (calc) Final  . Sodium 11/07/2020 140  135 - 146 mmol/L Final  . Potassium 11/07/2020 4.2  3.5 - 5.3 mmol/L Final  . Chloride 11/07/2020 105  98 - 110 mmol/L Final  . CO2 11/07/2020 27  20 - 32 mmol/L Final  . Calcium 11/07/2020 9.7  8.6 - 10.4 mg/dL Final  . Total Protein 11/07/2020 6.6  6.1 - 8.1 g/dL Final  . Albumin 76/72/0947 4.4  3.6 - 5.1 g/dL Final  . Globulin 09/62/8366 2.2  1.9 - 3.7 g/dL (calc) Final  . AG Ratio 11/07/2020 2.0  1.0 - 2.5 (calc) Final  . Total Bilirubin 11/07/2020 0.4  0.2 - 1.2 mg/dL Final  . Alkaline phosphatase (APISO) 11/07/2020 62  37 - 153 U/L Final  . AST 11/07/2020 19  10 - 35 U/L Final  . ALT 11/07/2020 21  6 - 29 U/L Final  . WBC 11/07/2020 8.2  3.8 - 10.8 Thousand/uL Final  . RBC 11/07/2020 4.44  3.80 - 5.10 Million/uL Final  . Hemoglobin 11/07/2020 13.5  11.7 - 15.5 g/dL Final  . HCT 80/08/4915 41.1  35.0 - 45.0 % Final  . MCV 11/07/2020 92.6  80.0 - 100.0 fL Final  . MCH 11/07/2020 30.4  27.0 - 33.0 pg Final  . MCHC 11/07/2020 32.8  32.0 - 36.0 g/dL Final  . RDW 91/50/5697 11.8  11.0 - 15.0 % Final  . Platelets 11/07/2020 328  140 - 400 Thousand/uL Final  . MPV 11/07/2020 9.2  7.5 - 12.5 fL Final  . Neutro Abs 11/07/2020 5,683  1,500 - 7,800 cells/uL Final  . Lymphs Abs 11/07/2020 1,624  850 - 3,900 cells/uL Final  . Absolute Monocytes 11/07/2020 681  200 - 950 cells/uL Final  . Eosinophils Absolute  11/07/2020 164  15 - 500 cells/uL Final  . Basophils Absolute 11/07/2020 49  0 - 200 cells/uL Final  . Neutrophils Relative % 11/07/2020 69.3  % Final  . Total Lymphocyte 11/07/2020 19.8  % Final  . Monocytes Relative 11/07/2020 8.3  % Final  . Eosinophils Relative 11/07/2020 2.0  % Final  . Basophils Relative 11/07/2020 0.6  % Final  . TSH 11/07/2020 4.19  mIU/L Final   Comment:           Reference Range .           > or = 20 Years  0.40-4.50 .                Pregnancy Ranges           First trimester    0.26-2.66           Second trimester   0.55-2.73           Third trimester    0.43-2.91     Past Medical History:  Diagnosis Date  . Anemia   . DUB (dysfunctional uterine bleeding)    Current Outpatient Medications on File Prior to Visit  Medication Sig Dispense Refill  . levonorgestrel (MIRENA) 20 MCG/24HR IUD 1 each by Intrauterine route once.     No current facility-administered medications on file prior to visit.   No Known Allergies Social History   Socioeconomic History  . Marital status: Divorced    Spouse name: Not on file  . Number of children: 4  . Years of education: Not on file  . Highest education level: High school graduate  Occupational History    Employer: [employer]  Tobacco Use  . Smoking status: Never Smoker  . Smokeless tobacco: Never Used  Vaping Use  . Vaping Use: Never used  Substance and Sexual Activity  . Alcohol use: No    Comment: occ  . Drug use: No  . Sexual activity: Not Currently  Other Topics Concern  . Not on file  Social History Narrative  . Not on file   Social Determinants of Health   Financial Resource Strain: Not on file  Food Insecurity: Not on file  Transportation Needs: Not on file  Physical Activity: Not on file  Stress: Not on file  Social Connections: Not on file  Intimate Partner Violence: Not on file    Review of Systems  All other systems reviewed and are negative.      Objective:   Physical  Exam Vitals  reviewed.  Constitutional:      General: She is not in acute distress.    Appearance: She is well-developed. She is not diaphoretic.  HENT:     Head: Normocephalic and atraumatic.  Eyes:     General: No scleral icterus.    Conjunctiva/sclera: Conjunctivae normal.     Pupils: Pupils are equal, round, and reactive to light.  Cardiovascular:     Rate and Rhythm: Normal rate and regular rhythm.  Pulmonary:     Effort: Pulmonary effort is normal.     Breath sounds: Normal breath sounds.  Neurological:     Mental Status: She is alert and oriented to person, place, and time.  Psychiatric:        Behavior: Behavior normal.        Thought Content: Thought content normal.        Judgment: Judgment normal.           Assessment & Plan:   General medical exam  Labs are excellent.  Recommended a mammogram.  Patient declined.  Recommended colonoscopy.  Patient declined.  Suggested Cologuard.  Patient will allow me to schedule her for Cologuard.  Recommended the shingles vaccine.  Recommended a COVID shot.  Regular anticipatory guidance is provided.

## 2020-11-27 LAB — COLOGUARD: Cologuard: NEGATIVE

## 2020-12-10 NOTE — Telephone Encounter (Signed)
Received the results of Cologuard screening.   Screening noted negative.   A negative result indicates a low likelihood of colorectal cancer is present. Following a negative Cologuard result, the American Cancer Society recommends a Cologuard re-screening interval of 3 years.   Letter sent.   

## 2021-01-03 ENCOUNTER — Encounter (HOSPITAL_COMMUNITY): Payer: Self-pay | Admitting: Emergency Medicine

## 2021-01-03 ENCOUNTER — Emergency Department (HOSPITAL_COMMUNITY)
Admission: EM | Admit: 2021-01-03 | Discharge: 2021-01-03 | Disposition: A | Discharge status: Home / Self Care | Payer: Managed Care, Other (non HMO) | Attending: Emergency Medicine | Admitting: Emergency Medicine

## 2021-01-03 DIAGNOSIS — S199XXA Unspecified injury of neck, initial encounter: Secondary | ICD-10-CM | POA: Diagnosis present

## 2021-01-03 DIAGNOSIS — S161XXA Strain of muscle, fascia and tendon at neck level, initial encounter: Secondary | ICD-10-CM | POA: Diagnosis not present

## 2021-01-03 DIAGNOSIS — Y92410 Unspecified street and highway as the place of occurrence of the external cause: Secondary | ICD-10-CM | POA: Diagnosis not present

## 2021-01-03 DIAGNOSIS — M25562 Pain in left knee: Secondary | ICD-10-CM | POA: Insufficient documentation

## 2021-01-03 DIAGNOSIS — M545 Low back pain, unspecified: Secondary | ICD-10-CM | POA: Insufficient documentation

## 2021-01-03 DIAGNOSIS — M25561 Pain in right knee: Secondary | ICD-10-CM | POA: Insufficient documentation

## 2021-01-03 MED ORDER — METHOCARBAMOL 500 MG PO TABS: 500.0000 mg | ORAL_TABLET | Freq: Two times a day (BID) | ORAL | 0 refills | Status: DC | PRN

## 2021-01-03 NOTE — ED Notes (Signed)
Pt verbalized understanding of discharge paperwork and prescriptions.  °
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
## 2021-01-03 NOTE — Discharge Instructions (Addendum)
I am prescribing you a strong muscle relaxer called Robaxin.  You can take this up to 2 times a day for management of your pain.  This medication can be sedating so do not mix it with alcohol.  Do not drive a car after taking it.  I recommend a combination of tylenol and ibuprofen for management of your pain. You can take a low dose of both at the same time. I recommend 500 mg of Tylenol combined with 600 mg of ibuprofen. This is one maximum strength Tylenol and three regular ibuprofen. You can take these 2-3 times for day for your pain. Please try to take these medications with a small amount of food as well to prevent upsetting your stomach.  Please continue to apply heating pads as well as take hot showers to help loosen her muscles.  I would recommend stretching and light exercise as your pain tolerates.  If you develop any new or worsening symptoms please come back to the emergency department.  Please follow-up with your regular doctor if your symptoms persist.  It was a pleasure to meet you.
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
## 2021-01-20 ENCOUNTER — Telehealth: Payer: Self-pay

## 2021-01-20 ENCOUNTER — Other Ambulatory Visit: Payer: Self-pay | Admitting: Family Medicine

## 2021-01-20 MED ORDER — CYCLOBENZAPRINE HCL 5 MG PO TABS: 5.0000 mg | ORAL_TABLET | Freq: Three times a day (TID) | ORAL | 1 refills | Status: DC | PRN

## 2021-01-20 NOTE — Telephone Encounter (Signed)
Pt has declines the flexeril, will follow up at appt 01/29/21

## 2021-01-29 ENCOUNTER — Encounter: Payer: Self-pay | Admitting: Family Medicine

## 2021-01-29 ENCOUNTER — Telehealth: Payer: Self-pay | Admitting: Family Medicine

## 2021-01-29 ENCOUNTER — Other Ambulatory Visit: Payer: Self-pay

## 2021-01-29 ENCOUNTER — Ambulatory Visit (INDEPENDENT_AMBULATORY_CARE_PROVIDER_SITE_OTHER): Payer: Managed Care, Other (non HMO) | Admitting: Family Medicine

## 2021-01-29 VITALS — BP 104/72 | HR 90 | Temp 98.3°F | Resp 14 | Ht 63.0 in | Wt 141.0 lb

## 2021-01-29 DIAGNOSIS — M5431 Sciatica, right side: Secondary | ICD-10-CM

## 2021-01-29 DIAGNOSIS — S39012D Strain of muscle, fascia and tendon of lower back, subsequent encounter: Secondary | ICD-10-CM

## 2021-01-29 DIAGNOSIS — S134XXD Sprain of ligaments of cervical spine, subsequent encounter: Secondary | ICD-10-CM | POA: Diagnosis not present

## 2021-01-29 MED ORDER — PREDNISONE 20 MG PO TABS: ORAL_TABLET | ORAL | 0 refills | Status: DC
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
## 2021-01-30 ENCOUNTER — Ambulatory Visit
Admission: RE | Admit: 2021-01-30 | Discharge: 2021-01-30 | Disposition: A | Discharge status: Home / Self Care | Payer: Managed Care, Other (non HMO) | Source: Ambulatory Visit | Attending: Family Medicine | Admitting: Family Medicine

## 2021-01-30 ENCOUNTER — Encounter: Payer: Self-pay | Admitting: Radiology

## 2021-01-30 DIAGNOSIS — M5431 Sciatica, right side: Secondary | ICD-10-CM

## 2021-02-04 NOTE — Telephone Encounter (Signed)
Patient called with correction; the claim number is [id number]; it's not the ID number. Please advise at [phone] with questions.

## 2021-02-12 ENCOUNTER — Telehealth: Payer: Self-pay | Admitting: Family Medicine

## 2021-02-12 NOTE — Telephone Encounter (Signed)
Patient called to speak with nurse about lower back pain. Please advise at [phone].

## 2021-02-13 NOTE — Telephone Encounter (Signed)
Call placed to patient. No answer. No VM.  

## 2021-02-16 NOTE — Telephone Encounter (Signed)
Routing to our billing department

## 2021-03-10 ENCOUNTER — Ambulatory Visit (INDEPENDENT_AMBULATORY_CARE_PROVIDER_SITE_OTHER): Payer: Managed Care, Other (non HMO) | Admitting: Family Medicine

## 2021-03-10 ENCOUNTER — Other Ambulatory Visit: Payer: Self-pay

## 2021-03-10 ENCOUNTER — Encounter: Payer: Self-pay | Admitting: Family Medicine

## 2021-03-10 VITALS — BP 108/70 | HR 84 | Ht 63.0 in | Wt 142.0 lb

## 2021-03-10 DIAGNOSIS — S134XXD Sprain of ligaments of cervical spine, subsequent encounter: Secondary | ICD-10-CM

## 2021-03-10 DIAGNOSIS — S39012D Strain of muscle, fascia and tendon of lower back, subsequent encounter: Secondary | ICD-10-CM

## 2021-03-10 MED ORDER — MELOXICAM 15 MG PO TABS: 15.0000 mg | ORAL_TABLET | Freq: Every day | ORAL | 3 refills | Status: DC
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
## 2021-03-17 ENCOUNTER — Other Ambulatory Visit: Payer: Self-pay | Admitting: Family Medicine

## 2021-03-17 ENCOUNTER — Telehealth: Payer: Self-pay

## 2021-03-17 DIAGNOSIS — S39012D Strain of muscle, fascia and tendon of lower back, subsequent encounter: Secondary | ICD-10-CM

## 2021-03-17 NOTE — Telephone Encounter (Signed)
This pt called in inquiring about setting up her appt for rehab. Pt stated that she has called and left messages to be contacted with place, and appt times. Please advise.  Cb#: [phone]

## 2021-03-18 ENCOUNTER — Ambulatory Visit: Payer: Managed Care, Other (non HMO) | Attending: Family Medicine | Admitting: Physical Therapy

## 2021-03-18 ENCOUNTER — Other Ambulatory Visit: Payer: Self-pay

## 2021-03-18 ENCOUNTER — Encounter: Payer: Self-pay | Admitting: Physical Therapy

## 2021-03-18 DIAGNOSIS — M6281 Muscle weakness (generalized): Secondary | ICD-10-CM | POA: Diagnosis present

## 2021-03-18 DIAGNOSIS — M545 Low back pain, unspecified: Secondary | ICD-10-CM | POA: Diagnosis present

## 2021-03-18 DIAGNOSIS — R293 Abnormal posture: Secondary | ICD-10-CM | POA: Diagnosis present

## 2021-03-18 DIAGNOSIS — G8929 Other chronic pain: Secondary | ICD-10-CM | POA: Diagnosis present

## 2021-03-18 DIAGNOSIS — M62830 Muscle spasm of back: Secondary | ICD-10-CM | POA: Insufficient documentation
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
## 2021-03-24 ENCOUNTER — Encounter: Payer: Self-pay | Admitting: Physical Therapy

## 2021-03-24 ENCOUNTER — Other Ambulatory Visit: Payer: Self-pay

## 2021-03-24 ENCOUNTER — Ambulatory Visit: Payer: Managed Care, Other (non HMO) | Admitting: Physical Therapy

## 2021-03-24 DIAGNOSIS — M62830 Muscle spasm of back: Secondary | ICD-10-CM

## 2021-03-24 DIAGNOSIS — G8929 Other chronic pain: Secondary | ICD-10-CM

## 2021-03-24 DIAGNOSIS — R293 Abnormal posture: Secondary | ICD-10-CM

## 2021-03-24 DIAGNOSIS — M545 Low back pain, unspecified: Secondary | ICD-10-CM

## 2021-03-24 DIAGNOSIS — M6281 Muscle weakness (generalized): Secondary | ICD-10-CM

## 2021-03-24 NOTE — Therapy (Signed)
[facility] Outpatient Rehabilitation [facility] [address] [place], [place], [address] Phone: [phone]   Fax:  [phone]  Physical Therapy Treatment  Patient Details  Name: Mary Cross MRN: [id number] Date of Birth: 1965-09-29 Referring Provider (PT): Donita Brooks, MD   Encounter Date: 03/24/2021   PT End of Session - 03/24/21 1419     Visit Number 2    Number of Visits 13    Date for PT Re-Evaluation 04/29/21    Authorization Type med pay/ cigna    PT Start Time 1100    PT Stop Time 1150    PT Time Calculation (min) 50 min             Past Medical History:  Diagnosis Date   Anemia    DUB (dysfunctional uterine bleeding)     History reviewed. No pertinent surgical history.  There were no vitals filed for this visit.   Subjective Assessment - 03/24/21 1118     Subjective My pain was better after therapist popped my hip last last appointment. I have not had to take meloxicam. I was scared to come today because I do not want the pain to come back. No constant pain. Pain happens with certain movements like bending.    Diagnostic tests 01/30/2021 IMPRESSION:  Mild degenerative change without acute abnormality.    Currently in Pain? No/denies    Pain Frequency Intermittent    Aggravating Factors  bending, lifting    Pain Relieving Factors popping hip                               [facility] Adult PT Treatment/Exercise - 03/24/21 0001       Therapeutic Activites    Therapeutic Activities ADL's    ADL's Began body mechanics training. Pt requires max cues for spine alignement and hip hinge- used wooden dowel for feedback- she has pain with retun to extension.      Lumbar Exercises: Stretches   Single Knee to Chest Stretch 2 reps;30 seconds    Standing Extension 5 reps;5 seconds    Figure 4 Stretch 2 reps;30 seconds    Figure 4 Stretch Limitations push and pull      Lumbar Exercises: Supine   Pelvic Tilt Limitations  mod cues- increased pain , discontinued      Modalities   Modalities Moist Heat      Moist Heat Therapy   Number Minutes Moist Heat 10 Minutes    Moist Heat Location Lumbar Spine                       PT Short Term Goals - 03/18/21 1245       PT SHORT TERM GOAL #1   Title pt to be IND with inital HEP    Time 3    Period Weeks    Status New    Target Date 04/08/21      PT SHORT TERM GOAL #2   Title pt to verbalize/ demo efficient posture and lifting mechanics to reduce and prevent low back pain    Time 3    Period Weeks    Status New    Target Date 04/08/21               PT Long Term Goals - 03/18/21 1245       PT LONG TERM GOAL #1   Title maintain trunk flexion/ extension and  increased R sidebending by >/= 10 degrees with no report of pain/ limtaitons for ADLS    Time 6    Period Weeks    Status New    Target Date 04/29/21      PT LONG TERM GOAL #2   Title pt to be able to sit/ stand and walk for >/= 60 min with no report of pain or limitations for endurance required for work related tasks    Time 6    Period Weeks    Status New    Target Date 04/29/21      PT LONG TERM GOAL #3   Title increase gross LLE strenght to >/= 4+/5 to promote hip/ SIJ stability    Time 6    Period Weeks    Status New    Target Date 04/29/21      PT LONG TERM GOAL #4   Title increase FOTO score to >/=73% to demo improvement in function    Time 6    Period Weeks    Status New    Target Date 04/29/21      PT LONG TERM GOAL #5   Title pt to be IND with all HEP to be able to maintain and progress current LOF    Time 6    Period Weeks    Status New    Target Date 04/29/21                   Plan - 03/24/21 1121     Clinical Impression Statement Mary Cross reports improvement in pain after hip manipulation last session. She is anxious to particpiate today in fear her pain will increase. She reports pain is more intermittent and with bending motions. She  is careful not to increase her pain. Began body mechanics education with pt requiring max cues for proper hip hinge and use of wooden dowel for tactile feedback. Instructed Pt in lumbar and hip stretches with good tolerance. Began core activation with PPT which required max cues for her complete correctly. She reported increased pain so discontinued PPT and applied HMP which she reported decreased her pain at end of session. Her HEP was not updated today.    PT Treatment/Interventions ADLs/Self Care Home Management;Cryotherapy;Electrical Stimulation;Iontophoresis 4mg /ml Dexamethasone;Moist Heat;Traction;Ultrasound;Gait training;Stair training;Therapeutic activities;Therapeutic exercise;Balance training;Neuromuscular re-education;Patient/family education;Manual techniques;Passive range of motion;Dry needling;Taping;Spinal Manipulations;Joint Manipulations    PT Next Visit Plan review/ update HEP, L posteriorly pos innominate , hamstring stretching, SLR, LAD LLE, core activation, STW along hamsring/ L lumbar paraspinals    PT Home Exercise Plan [id number] -SLR, hamstring stretch (seated and supine), LTR             Patient will benefit from skilled therapeutic intervention in order to improve the following deficits and impairments:  Improper body mechanics, Increased muscle spasms, Decreased strength, Postural dysfunction, Pain, Decreased activity tolerance, Decreased endurance, Decreased range of motion, Decreased balance  Visit Diagnosis: Chronic left-sided low back pain, unspecified whether sciatica present  Muscle spasm of back  Muscle weakness (generalized)  Abnormal posture     Problem List Patient Active Problem List   Diagnosis Date Noted   Anemia    DUB (dysfunctional uterine bleeding)     , PTA 03/24/2021, 2:22 PM  [facility] Outpatient Rehabilitation [facility] [address] [place], [place], [place] Phone: [phone]   Fax:   [phone]  Name: Mary Cross MRN: Iva Boop Date of Birth: [birth year]

## 2021-04-01 ENCOUNTER — Ambulatory Visit: Payer: Managed Care, Other (non HMO) | Attending: Family Medicine | Admitting: Physical Therapy

## 2021-04-01 ENCOUNTER — Other Ambulatory Visit: Payer: Self-pay

## 2021-04-01 DIAGNOSIS — M6281 Muscle weakness (generalized): Secondary | ICD-10-CM | POA: Diagnosis present

## 2021-04-01 DIAGNOSIS — M545 Low back pain, unspecified: Secondary | ICD-10-CM | POA: Insufficient documentation

## 2021-04-01 DIAGNOSIS — R293 Abnormal posture: Secondary | ICD-10-CM | POA: Diagnosis present

## 2021-04-01 DIAGNOSIS — G8929 Other chronic pain: Secondary | ICD-10-CM | POA: Diagnosis present

## 2021-04-01 DIAGNOSIS — M62830 Muscle spasm of back: Secondary | ICD-10-CM | POA: Diagnosis present

## 2021-04-01 NOTE — Therapy (Signed)
[facility] Outpatient Rehabilitation [facility] [address] [place], [place], [address] Phone: [phone]   Fax:  [phone]  Physical Therapy Treatment  Patient Details  Name: Mary Cross MRN: [id number] Date of Birth: 1965/10/25 Referring Provider (PT): Donita Brooks, MD   Encounter Date: 04/01/2021   PT End of Session - 04/01/21 0812     Visit Number 3    Number of Visits 13    Date for PT Re-Evaluation 04/29/21    Authorization Type med pay/ cigna    PT Start Time 0800    PT Stop Time 0845    PT Time Calculation (min) 45 min             Past Medical History:  Diagnosis Date   Anemia    DUB (dysfunctional uterine bleeding)     No past surgical history on file.  There were no vitals filed for this visit.   Subjective Assessment - 04/01/21 0812     Subjective My pain comes and goes but is worse today 7/10. Pain has been averaging 4/10.    Currently in Pain? Yes    Pain Score 7     Pain Location Back    Pain Orientation Mid;Lower    Pain Descriptors / Indicators Aching;Sore    Pain Type Chronic pain    Aggravating Factors  bending, lifting    Pain Relieving Factors hip manip                [facility] PT Assessment - 04/01/21 0001       AROM   AROM Assessment Site --   True  84.6 left , 85.8 right ; apparent    96.3 Left   95.1 Right     Special Tests    Special Tests Leg LengthTest    Other special tests True  84.6 left , 85.8 right ; apparent    96.3 Left   95.1 Right                           [facility] Adult PT Treatment/Exercise - 04/01/21 0001       Therapeutic Activites    ADL's heel lift - trial - remove if bothersome; use of SI belt for support; education on equal weightbearing to prevent pelvic rotation      Lumbar Exercises: Stretches   Figure 4 Stretch 2 reps;30 seconds    Figure 4 Stretch Limitations push and pull      Lumbar Exercises: Supine   Other Supine Lumbar Exercises ball squeeze 5  sec x 10 x 2    Other Supine Lumbar Exercises clam with  blue band x 20      Manual Therapy   Manual Therapy Joint mobilization;Muscle Energy Technique;Other (comment)    Other Manual Therapy LAD right hip manipulation    Muscle Energy Technique shotgun pelvic reset technique with cavitaion                       PT Short Term Goals - 03/18/21 1245       PT SHORT TERM GOAL #1   Title pt to be IND with inital HEP    Time 3    Period Weeks    Status New    Target Date 04/08/21      PT SHORT TERM GOAL #2   Title pt to verbalize/ demo efficient posture and lifting mechanics to reduce and prevent low back  pain    Time 3    Period Weeks    Status New    Target Date 04/08/21               PT Long Term Goals - 03/18/21 1245       PT LONG TERM GOAL #1   Title maintain trunk flexion/ extension and increased R sidebending by >/= 10 degrees with no report of pain/ limtaitons for ADLS    Time 6    Period Weeks    Status New    Target Date 04/29/21      PT LONG TERM GOAL #2   Title pt to be able to sit/ stand and walk for >/= 60 min with no report of pain or limitations for endurance required for work related tasks    Time 6    Period Weeks    Status New    Target Date 04/29/21      PT LONG TERM GOAL #3   Title increase gross LLE strenght to >/= 4+/5 to promote hip/ SIJ stability    Time 6    Period Weeks    Status New    Target Date 04/29/21      PT LONG TERM GOAL #4   Title increase FOTO score to >/=73% to demo improvement in function    Time 6    Period Weeks    Status New    Target Date 04/29/21      PT LONG TERM GOAL #5   Title pt to be IND with all HEP to be able to maintain and progress current LOF    Time 6    Period Weeks    Status New    Target Date 04/29/21                   Plan - 04/01/21 1225     Clinical Impression Statement Kathleen arrives reporting increased pain today in her lower back. Anne Ng DPT available to  assess SI and perform manual hip manipulation as well as muscle energy technique to correct pelviv alignment. LLD assess and found to be shorter on left side. Pt issued heel lift for trial. 2 levels felt too high so reduced to 1 level heel lift on left. Also, instructed pt in use of clinic SI belt and shown online options she can order if she feels it is helpful with her pain. Continued with hip stabilization and updated HEP. Pt reported decreased pain at end of session.    PT Treatment/Interventions ADLs/Self Care Home Management;Cryotherapy;Electrical Stimulation;Iontophoresis 4mg /ml Dexamethasone;Moist Heat;Traction;Ultrasound;Gait training;Stair training;Therapeutic activities;Therapeutic exercise;Balance training;Neuromuscular re-education;Patient/family education;Manual techniques;Passive range of motion;Dry needling;Taping;Spinal Manipulations;Joint Manipulations    PT Next Visit Plan review/ update HEP, L posteriorly pos innominate , hamstring stretching, SLR, LAD LLE, core activation, STW along hamsring/ L lumbar paraspinals    PT Home Exercise Plan [id number] -SLR, hamstring stretch (seated and supine), LTR    Consulted and Agree with Plan of Care Patient             Patient will benefit from skilled therapeutic intervention in order to improve the following deficits and impairments:  Improper body mechanics, Increased muscle spasms, Decreased strength, Postural dysfunction, Pain, Decreased activity tolerance, Decreased endurance, Decreased range of motion, Decreased balance  Visit Diagnosis: Chronic left-sided low back pain, unspecified whether sciatica present  Muscle spasm of back  Muscle weakness (generalized)     Problem List Patient Active Problem List   Diagnosis Date Noted  Anemia    DUB (dysfunctional uterine bleeding)     Sherrie Mustache, PTA 04/01/2021, 12:38 PM  [facility] [address] [place],  [place], [address] Phone: [phone]   Fax:  [phone]  Name: Minsa Weddington MRN: [id number] Date of Birth: 10-29-1965

## 2021-04-03 ENCOUNTER — Encounter: Payer: Self-pay | Admitting: Physical Therapy

## 2021-04-03 ENCOUNTER — Ambulatory Visit: Payer: Managed Care, Other (non HMO) | Admitting: Physical Therapy

## 2021-04-03 ENCOUNTER — Other Ambulatory Visit: Payer: Self-pay

## 2021-04-03 DIAGNOSIS — M6281 Muscle weakness (generalized): Secondary | ICD-10-CM

## 2021-04-03 DIAGNOSIS — R293 Abnormal posture: Secondary | ICD-10-CM

## 2021-04-03 DIAGNOSIS — M545 Low back pain, unspecified: Secondary | ICD-10-CM | POA: Diagnosis not present

## 2021-04-03 DIAGNOSIS — M62830 Muscle spasm of back: Secondary | ICD-10-CM

## 2021-04-03 DIAGNOSIS — G8929 Other chronic pain: Secondary | ICD-10-CM

## 2021-04-03 NOTE — Therapy (Addendum)
[place], [place], [address] Phone: [phone]   Fax:  [phone]  Physical Therapy Treatment  Patient Details  Name: Mary Cross MRN: [id number] Date of Birth: 01-Apr-1966 Referring Provider (PT): Susy Frizzle, MD   Encounter Date: 04/03/2021   PT End of Session - 04/03/21 0735     Visit Number 4    Number of Visits 13    Date for PT Re-Evaluation 04/29/21    Authorization Type med pay/ cigna    PT Start Time 0715    PT Stop Time 0800    PT Time Calculation (min) 45 min             Past Medical History:  Diagnosis Date   Anemia    DUB (dysfunctional uterine bleeding)     History reviewed. No pertinent surgical history.  There were no vitals filed for this visit.   Subjective Assessment - 04/03/21 0716     Subjective It was hurting when I left. I took meloxicam, worked yesterday but pain was not terrible. Today pain is a little bit, maybe a 1/10 so  I am excited about the heel lift.    Currently in Pain? Yes    Pain Score 1     Pain Location Back    Pain Orientation Mid    Pain Descriptors / Indicators Aching    Pain Type Chronic pain    Aggravating Factors  bending, lifting    Pain Relieving Factors hip manip                [facility] PT Assessment - 04/03/21 0001       AROM   Lumbar - Right Side Bend 22    Lumbar - Left Side Bend 20   pain on right             [facility] Adult PT Treatment/Exercise - 04/03/21 0001       Self-Care   Self-Care RICE      Therapeutic Activites    ADL's Body mechanics - hip hinge training used wooden dowel- after mod/max cues able to retrun demo without wooden dowel - still needs reinforcement to maintain with multiple reps      Lumbar Exercises: Supine   Bridge with clamshell 20 reps    Bridge with [employer] Limitations blue    Other Supine Lumbar Exercises ball squeeze 5 sec x 10 x 2    Other Supine Lumbar Exercises clam  with  blue band x 20                       PT Short Term Goals - 04/03/21 0857       PT SHORT TERM GOAL #1   Title pt to be IND with inital HEP    Time 3    Period Weeks    Status Achieved    Target Date 04/08/21      PT SHORT TERM GOAL #2   Title pt to verbalize/ demo efficient posture and lifting mechanics to reduce and prevent low back pain    Baseline can demonstrate with wooden dowel feedback and mod verbal cues    Time 3    Period Weeks    Status On-going    Target Date 04/08/21               PT Long Term Goals - 04/03/21 0857       PT LONG TERM GOAL #  1   Title maintain trunk flexion/ extension and increased R sidebending by >/= 10 degrees with no report of pain/ limtaitons for ADLS    Baseline Right side bending improved- heel lift in left shoe    Time 6    Period Weeks    Status Partially Met      PT LONG TERM GOAL #2   Title pt to be able to sit/ stand and walk for >/= 60 min with no report of pain or limitations for endurance required for work related tasks    Baseline still limited    Time 6    Period Weeks    Status On-going      PT LONG TERM GOAL #3   Title increase gross LLE strenght to >/= 4+/5 to promote hip/ SIJ stability    Time 6    Period Weeks    Status On-going      PT LONG TERM GOAL #4   Title increase FOTO score to >/=73% to demo improvement in function    Baseline 57% improved to 61%    Time 6    Period Weeks    Status On-going      PT LONG TERM GOAL #5   Title pt to be IND with all HEP to be able to maintain and progress current LOF    Time 6    Period Weeks    Status On-going                   Plan - 04/03/21 0913     Clinical Impression Statement Mary Cross reports she has been doing well the last couple of days. She is unsure if it is the heel lift or the Meloxicam she started taking a few days ago. Continued with stabilization exercises and body mechanics training. She continues with difficulty  maintaining neutral spine with bending and lifting more than 1-2 reps. She would like to HOLD PT at this time due to feeling better and not wanting to aggravate her pain. She will return in 2-3 weeks if needed. FOTO score improved slightly since eval, but limited visits thus far.    PT Next Visit Plan re-assess if pt returns otherwise DC if becomes inactive past 04/28/21    PT Home Exercise Plan [id number] -SLR, hamstring stretch (seated and supine), LTR             Patient will benefit from skilled therapeutic intervention in order to improve the following deficits and impairments:  Improper body mechanics, Increased muscle spasms, Decreased strength, Postural dysfunction, Pain, Decreased activity tolerance, Decreased endurance, Decreased range of motion, Decreased balance  Visit Diagnosis: Chronic left-sided low back pain, unspecified whether sciatica present  Muscle spasm of back  Muscle weakness (generalized)  Abnormal posture     Problem List Patient Active Problem List   Diagnosis Date Noted   Anemia    DUB (dysfunctional uterine bleeding)     Dorene Ar, PTA 04/03/2021, 9:20 AM  [place], [place], [address] Phone: [phone]   Fax:  [phone]  Name: Mary Cross MRN: [id number] Date of Birth: 1965/08/23      [place]  Visits from Start of Care: 4  Current functional level related to goals / functional outcomes: See goals   Remaining deficits: Current status unknown   Education / Equipment: HEP   Patient agrees to discharge. Patient goals were not met. Patient is being discharged due to  not returning since the last visit.  Kristoffer Leamon PT, DPT, LAT, ATC  05/04/21  12:26 PM

## 2021-04-08 ENCOUNTER — Encounter: Payer: Managed Care, Other (non HMO) | Admitting: Physical Therapy

## 2021-04-28 ENCOUNTER — Ambulatory Visit: Payer: No Typology Code available for payment source | Admitting: Physical Therapy

## 2021-04-29 ENCOUNTER — Encounter: Payer: Self-pay | Admitting: Physical Therapy

## 2021-07-31 ENCOUNTER — Encounter: Payer: Self-pay | Admitting: Nurse Practitioner

## 2021-07-31 ENCOUNTER — Other Ambulatory Visit: Payer: Self-pay

## 2021-07-31 ENCOUNTER — Ambulatory Visit (INDEPENDENT_AMBULATORY_CARE_PROVIDER_SITE_OTHER): Payer: Managed Care, Other (non HMO) | Admitting: Nurse Practitioner

## 2021-07-31 VITALS — BP 140/88 | HR 107 | Ht 63.0 in | Wt 138.0 lb

## 2021-07-31 DIAGNOSIS — F410 Panic disorder [episodic paroxysmal anxiety] without agoraphobia: Secondary | ICD-10-CM

## 2021-07-31 DIAGNOSIS — Z566 Other physical and mental strain related to work: Secondary | ICD-10-CM
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
## 2021-08-17 ENCOUNTER — Encounter: Payer: Self-pay | Admitting: Family Medicine

## 2021-08-17 ENCOUNTER — Telehealth: Payer: Self-pay | Admitting: Family Medicine

## 2021-08-17 NOTE — Telephone Encounter (Signed)
Patient will try to send paperwork through MyChart for nurse to review.

## 2021-08-17 NOTE — Telephone Encounter (Signed)
Spoke with patient. She doesn't have the paperwork handy to fax it here; states it's the same exact paperwork that was completed last year (some time between March and May of 2022).   Please advise at [phone].

## 2021-08-17 NOTE — Telephone Encounter (Signed)
Patient left voicemail message stating she wants labs done for "Know your numbers wellness" at her job. Needs ppwrk filled out; will need cholesterol, BMI and bp. Wants to know if someone knows which code will allow for it to go through her insurance. If we can do it, she'll fast tonight and come tomorrow.   Also specified she's unable to use the labs from last year when same paperwork was completed.  Called patient back to follow up and ask her to fax paperwork over for review before an answer can be given; mailbox is full.  Please advise at [phone].

## 2021-08-18 NOTE — Telephone Encounter (Signed)
Patient has picked up paperwork.
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
## 2022-08-04 ENCOUNTER — Telehealth: Payer: Self-pay | Admitting: Family Medicine

## 2022-08-04 NOTE — Telephone Encounter (Signed)
Patient called to request an appointment for a wellness check required by her employer annually. Patient needs an appointment for labs and other things; unsure if this should be a nurse visit with labs or just a lab visit.  Also, patient requesting appointment tomorrow or Friday morning since she's off.   Please advise at [phone].

## 2022-08-06 ENCOUNTER — Ambulatory Visit: Payer: Managed Care, Other (non HMO)

## 2022-08-06 ENCOUNTER — Other Ambulatory Visit: Payer: Managed Care, Other (non HMO)

## 2022-08-06 DIAGNOSIS — D649 Anemia, unspecified: Secondary | ICD-10-CM

## 2022-08-06 DIAGNOSIS — R5383 Other fatigue: Secondary | ICD-10-CM

## 2022-08-06 DIAGNOSIS — Z13220 Encounter for screening for lipoid disorders: Secondary | ICD-10-CM

## 2022-08-06 NOTE — Progress Notes (Signed)
Per pt need her bp check in order to fill out Wellness screening form.   This morning 96/68

## 2022-08-07 LAB — LIPID PANEL
Cholesterol: 154 mg/dL (ref <200)
HDL: 71 mg/dL (ref >=50)
LDL Cholesterol (Calc): 70 mg/dL (calc)
Non-HDL Cholesterol (Calc): 83 mg/dL (calc) (ref <130)
Total CHOL/HDL Ratio: 2.2 (calc) (ref <5.0)
Triglycerides: 47 mg/dL (ref <150)

## 2022-08-07 LAB — CBC WITH DIFFERENTIAL/PLATELET
Absolute Monocytes: 687 cells/uL (ref 200–950)
Basophils Absolute: 47 cells/uL (ref 0–200)
Basophils Relative: 0.6 %
Eosinophils Absolute: 119 cells/uL (ref 15–500)
Eosinophils Relative: 1.5 %
HCT: 33.4 % — ABNORMAL LOW (ref 35.0–45.0)
Hemoglobin: 10.8 g/dL — ABNORMAL LOW (ref 11.7–15.5)
Lymphs Abs: 1714 cells/uL (ref 850–3900)
MCH: 27.4 pg (ref 27.0–33.0)
MCHC: 32.3 g/dL (ref 32.0–36.0)
MCV: 84.8 fL (ref 80.0–100.0)
MPV: 9.3 fL (ref 7.5–12.5)
Monocytes Relative: 8.7 %
Neutro Abs: 5333 cells/uL (ref 1500–7800)
Neutrophils Relative %: 67.5 %
Platelets: 389 10*3/uL (ref 140–400)
RBC: 3.94 10*6/uL (ref 3.80–5.10)
RDW: 12.4 % (ref 11.0–15.0)
Total Lymphocyte: 21.7 %
WBC: 7.9 10*3/uL (ref 3.8–10.8)

## 2022-08-07 LAB — COMPLETE METABOLIC PANEL WITH GFR
AG Ratio: 1.9 (calc) (ref 1.0–2.5)
ALT: 14 U/L (ref 6–29)
AST: 17 U/L (ref 10–35)
Albumin: 4.4 g/dL (ref 3.6–5.1)
Alkaline phosphatase (APISO): 56 U/L (ref 37–153)
BUN: 15 mg/dL (ref 7–25)
CO2: 25 mmol/L (ref 20–32)
Calcium: 9.4 mg/dL (ref 8.6–10.4)
Chloride: 105 mmol/L (ref 98–110)
Creat: 0.73 mg/dL (ref 0.50–1.03)
Globulin: 2.3 g/dL (calc) (ref 1.9–3.7)
Glucose, Bld: 92 mg/dL (ref 65–99)
Potassium: 4.2 mmol/L (ref 3.5–5.3)
Sodium: 141 mmol/L (ref 135–146)
Total Bilirubin: 0.7 mg/dL (ref 0.2–1.2)
Total Protein: 6.7 g/dL (ref 6.1–8.1)
eGFR: 97 mL/min/{1.73_m2} (ref >=60)

## 2022-08-07 LAB — TSH: TSH: 2.22 mIU/L

## 2022-09-01 ENCOUNTER — Encounter: Payer: Self-pay | Admitting: Family Medicine

## 2022-09-20 ENCOUNTER — Ambulatory Visit: Payer: Managed Care, Other (non HMO) | Admitting: Family Medicine

## 2022-09-20 ENCOUNTER — Telehealth: Payer: Self-pay | Admitting: Family Medicine

## 2022-09-20 NOTE — Telephone Encounter (Signed)
Outbound call placed to acknowledge patient's message to cancel today's appt.  Spoke with patient; she stated the appointment isn't needed because her iron level is good; she was able to donate blood last week.   Nothing further needed at this time.

## 2022-10-25 ENCOUNTER — Ambulatory Visit: Payer: Managed Care, Other (non HMO) | Admitting: Family Medicine

## 2022-10-25 ENCOUNTER — Encounter: Payer: Self-pay | Admitting: Family Medicine

## 2022-10-25 VITALS — BP 120/72 | HR 73 | Temp 97.9°F | Ht 63.0 in | Wt 145.0 lb

## 2022-10-25 DIAGNOSIS — Z566 Other physical and mental strain related to work: Secondary | ICD-10-CM | POA: Diagnosis not present

## 2022-10-25 DIAGNOSIS — R079 Chest pain, unspecified: Secondary | ICD-10-CM

## 2022-10-25 DIAGNOSIS — J329 Chronic sinusitis, unspecified: Secondary | ICD-10-CM

## 2022-10-25 DIAGNOSIS — R5383 Other fatigue: Secondary | ICD-10-CM | POA: Diagnosis not present

## 2022-10-25 LAB — CBC WITH DIFFERENTIAL/PLATELET
Absolute Monocytes: 508 cells/uL (ref 200–950)
Basophils Absolute: 16 cells/uL (ref 0–200)
HCT: 39.2 % (ref 35.0–45.0)
Hemoglobin: 12.3 g/dL (ref 11.7–15.5)
Lymphs Abs: 2017 cells/uL (ref 850–3900)
MCH: 26.7 pg — ABNORMAL LOW (ref 27.0–33.0)
Neutro Abs: 5469 cells/uL (ref 1500–7800)
Platelets: 419 10*3/uL — ABNORMAL HIGH (ref 140–400)

## 2022-10-25 MED ORDER — AMOXICILLIN-POT CLAVULANATE 875-125 MG PO TABS: 1.0000 | ORAL_TABLET | Freq: Two times a day (BID) | ORAL | 0 refills | Status: AC

## 2022-10-25 NOTE — Progress Notes (Signed)
Subjective:    Patient ID: Mary Cross, female    DOB: 1965-07-19, 57 y.o.   MRN: [id number]  Cough Associated symptoms include chest pain.  Chest Pain  Associated symptoms include a cough.  Patient states that she feels extremely tired.  She has been sick for 8 days.  She reports head congestion.  She has pain and pressure in her maxillary sinuses.  Has nasal drip causing frequent coughing.  She is afebrile but she does have pressure in both maxillary sinuses.  She denies any pain or pressure in her frontal sinuses.  She denies any sore throat.  She also reports profound fatigue.  She states that she feels worn out.  Approximately 2 weeks ago, she woke up at night with sharp chest pain.  It lasted for a few minutes.  It is in the right and left side of the chest.  There is no exertional component.  There is no shortness of breath.  There is no hemoptysis.  There is no pleurisy.  EKG today shows normal sinus rhythm with normal intervals and normal axis with no evidence of ischemia or infarction.   Past Medical History:  Diagnosis Date   Anemia    DUB (dysfunctional uterine bleeding)    Current Outpatient Medications on File Prior to Visit  Medication Sig Dispense Refill   levonorgestrel (MIRENA) 20 MCG/24HR IUD 1 each by Intrauterine route once.     No current facility-administered medications on file prior to visit.   No Known Allergies Social History   Socioeconomic History   Marital status: Divorced    Spouse name: Not on file   Number of children: 4   Years of education: Not on file   Highest education level: High school graduate  Occupational History    Employer: [employer]  Tobacco Use   Smoking status: Never   Smokeless tobacco: Never  Vaping Use   Vaping Use: Never used  Substance and Sexual Activity   Alcohol use: No    Comment: occ   Drug use: No   Sexual activity: Not Currently  Other Topics Concern   Not on file  Social History Narrative   Not on file    Social Determinants of Health   Financial Resource Strain: Low Risk  (05/19/2018)   Overall Financial Resource Strain (CARDIA)    Difficulty of Paying Living Expenses: Not hard at all  Food Insecurity: No Food Insecurity (05/19/2018)   Hunger Vital Sign    Worried About Running Out of Food in the Last Year: Never true    Ran Out of Food in the Last Year: Never true  Transportation Needs: No Transportation Needs (05/19/2018)   PRAPARE - Administrator, Civil Service (Medical): No    Lack of Transportation (Non-Medical): No  Physical Activity: Inactive (05/19/2018)   Exercise Vital Sign    Days of Exercise per Week: 0 days    Minutes of Exercise per Session: 0 min  Stress: Stress Concern Present (05/19/2018)   [employer] of Occupational Health - Occupational Stress Questionnaire    Feeling of Stress : Rather much  Social Connections: Somewhat Isolated (05/19/2018)   Social Connection and Isolation Panel [NHANES]    Frequency of Communication with Friends and Family: More than three times a week    Frequency of Social Gatherings with Friends and Family: Once a week    Attends Religious Services: 1 to 4 times per year    Active Member of Clubs or Organizations: No  Attends Banker Meetings: Never    Marital Status: Divorced  Catering manager Violence: Not At Risk (05/19/2018)   Humiliation, Afraid, Rape, and Kick questionnaire    Fear of Current or Ex-Partner: No    Emotionally Abused: No    Physically Abused: No    Sexually Abused: No    Review of Systems  Respiratory:  Positive for cough.   Cardiovascular:  Positive for chest pain.  All other systems reviewed and are negative.      Objective:   Physical Exam Vitals reviewed.  Constitutional:      General: She is not in acute distress.    Appearance: She is well-developed. She is not diaphoretic.  HENT:     Head: Normocephalic and atraumatic.     Nose: Mucosal edema, congestion and  rhinorrhea present.     Right Turbinates: Enlarged.     Left Turbinates: Enlarged.     Right Sinus: Maxillary sinus tenderness present.     Left Sinus: Maxillary sinus tenderness present.  Eyes:     General: No scleral icterus.    Conjunctiva/sclera: Conjunctivae normal.     Pupils: Pupils are equal, round, and reactive to light.  Cardiovascular:     Rate and Rhythm: Normal rate and regular rhythm.     Heart sounds: Normal heart sounds. Heart sounds not distant. No murmur heard. Pulmonary:     Effort: Pulmonary effort is normal. No tachypnea, accessory muscle usage or respiratory distress.     Breath sounds: Normal breath sounds.  Neurological:     Mental Status: She is alert and oriented to person, place, and time.  Psychiatric:        Behavior: Behavior normal.        Thought Content: Thought content normal.        Judgment: Judgment normal.           Assessment & Plan:   Stress at work - Plan: EKG 12-Lead  Chest pain, unspecified type - Plan: EKG 12-Lead  Other fatigue - Plan: TSH, Vitamin B12, Iron, CBC with Differential/Platelet  Rhinosinusitis Her EKG is normal.  I believe the chest pain was likely muscular or perhaps even stress related.  I believe the fatigue could be related to her anemia.  It could also be related to a sinus infection that she is suffering from.  I will treat her for sinus infection with Augmentin 875 mg twice daily for 10 days.  I am also going to obtain blood work to evaluate for fatigue including a TSH, B12, iron, and a CBC.

## 2022-10-26 LAB — CBC WITH DIFFERENTIAL/PLATELET
Basophils Relative: 0.2 %
Eosinophils Absolute: 189 cells/uL (ref 15–500)
Eosinophils Relative: 2.3 %
MCHC: 31.4 g/dL — ABNORMAL LOW (ref 32.0–36.0)
MCV: 85.2 fL (ref 80.0–100.0)
MPV: 9.1 fL (ref 7.5–12.5)
Monocytes Relative: 6.2 %
Neutrophils Relative %: 66.7 %
RBC: 4.6 10*6/uL (ref 3.80–5.10)
RDW: 15 % (ref 11.0–15.0)
Total Lymphocyte: 24.6 %
WBC: 8.2 10*3/uL (ref 3.8–10.8)

## 2022-10-26 LAB — TSH: TSH: 1.76 mIU/L

## 2022-10-26 LAB — VITAMIN B12: Vitamin B-12: 699 pg/mL (ref 200–1100)

## 2022-10-26 LAB — IRON: Iron: 53 ug/dL (ref 45–160)

## 2023-07-06 ENCOUNTER — Other Ambulatory Visit: Payer: Managed Care, Other (non HMO)

## 2023-07-06 DIAGNOSIS — R079 Chest pain, unspecified: Secondary | ICD-10-CM

## 2023-07-06 DIAGNOSIS — D649 Anemia, unspecified: Secondary | ICD-10-CM

## 2023-07-06 DIAGNOSIS — R5383 Other fatigue: Secondary | ICD-10-CM

## 2023-07-06 DIAGNOSIS — Z13220 Encounter for screening for lipoid disorders: Secondary | ICD-10-CM

## 2023-07-06 DIAGNOSIS — N938 Other specified abnormal uterine and vaginal bleeding: Secondary | ICD-10-CM

## 2023-07-07 ENCOUNTER — Encounter: Payer: Self-pay | Admitting: Family Medicine

## 2023-07-07 LAB — CBC WITH DIFFERENTIAL/PLATELET
Absolute Lymphocytes: 2011 {cells}/uL (ref 850–3900)
Absolute Monocytes: 619 {cells}/uL (ref 200–950)
Basophils Absolute: 36 {cells}/uL (ref 0–200)
Basophils Relative: 0.4 %
Eosinophils Absolute: 173 {cells}/uL (ref 15–500)
Eosinophils Relative: 1.9 %
HCT: 41.6 % (ref 35.0–45.0)
Hemoglobin: 13.4 g/dL (ref 11.7–15.5)
MCH: 28.2 pg (ref 27.0–33.0)
MCHC: 32.2 g/dL (ref 32.0–36.0)
MCV: 87.6 fL (ref 80.0–100.0)
MPV: 9.4 fL (ref 7.5–12.5)
Monocytes Relative: 6.8 %
Neutro Abs: 6261 {cells}/uL (ref 1500–7800)
Neutrophils Relative %: 68.8 %
Platelets: 398 10*3/uL (ref 140–400)
RBC: 4.75 10*6/uL (ref 3.80–5.10)
RDW: 13.5 % (ref 11.0–15.0)
Total Lymphocyte: 22.1 %
WBC: 9.1 10*3/uL (ref 3.8–10.8)

## 2023-07-07 LAB — COMPLETE METABOLIC PANEL WITH GFR
AG Ratio: 2.1 (calc) (ref 1.0–2.5)
ALT: 12 U/L (ref 6–29)
AST: 16 U/L (ref 10–35)
Albumin: 4.9 g/dL (ref 3.6–5.1)
Alkaline phosphatase (APISO): 67 U/L (ref 37–153)
BUN: 16 mg/dL (ref 7–25)
CO2: 25 mmol/L (ref 20–32)
Calcium: 9.8 mg/dL (ref 8.6–10.4)
Chloride: 105 mmol/L (ref 98–110)
Creat: 0.73 mg/dL (ref 0.50–1.03)
Globulin: 2.3 g/dL (ref 1.9–3.7)
Glucose, Bld: 94 mg/dL (ref 65–99)
Potassium: 4.4 mmol/L (ref 3.5–5.3)
Sodium: 142 mmol/L (ref 135–146)
Total Bilirubin: 0.5 mg/dL (ref 0.2–1.2)
Total Protein: 7.2 g/dL (ref 6.1–8.1)
eGFR: 96 mL/min/{1.73_m2} (ref >=60)

## 2023-07-07 LAB — LIPID PANEL
Cholesterol: 168 mg/dL (ref <200)
HDL: 74 mg/dL (ref >=50)
LDL Cholesterol (Calc): 78 mg/dL
Non-HDL Cholesterol (Calc): 94 mg/dL (ref <130)
Total CHOL/HDL Ratio: 2.3 (calc) (ref <5.0)
Triglycerides: 79 mg/dL (ref <150)

## 2023-07-07 LAB — TSH: TSH: 2.49 m[IU]/L (ref 0.40–4.50)

## 2024-07-26 ENCOUNTER — Encounter: Payer: Self-pay | Admitting: Obstetrics & Gynecology

## 2024-07-26 ENCOUNTER — Ambulatory Visit: Admitting: Obstetrics & Gynecology

## 2024-07-26 ENCOUNTER — Other Ambulatory Visit (HOSPITAL_COMMUNITY)
Admission: RE | Admit: 2024-07-26 | Discharge: 2024-07-26 | Disposition: A | Source: Ambulatory Visit | Attending: Obstetrics & Gynecology | Admitting: Obstetrics & Gynecology

## 2024-07-26 VITALS — BP 132/81 | HR 99 | Ht 62.0 in | Wt 155.0 lb

## 2024-07-26 DIAGNOSIS — N841 Polyp of cervix uteri: Secondary | ICD-10-CM | POA: Diagnosis present

## 2024-07-26 DIAGNOSIS — R635 Abnormal weight gain: Secondary | ICD-10-CM

## 2024-07-26 DIAGNOSIS — Z124 Encounter for screening for malignant neoplasm of cervix: Secondary | ICD-10-CM | POA: Insufficient documentation

## 2024-07-26 DIAGNOSIS — Z30432 Encounter for removal of intrauterine contraceptive device: Secondary | ICD-10-CM

## 2024-07-26 NOTE — Progress Notes (Addendum)
 "  GYN VISIT Patient name: Mary Cross MRN [id number]  Date of birth: October 09, 1965 Chief Complaint:   Contraception (IUD removal)  History of Present Illness:   Mary Cross is a 59 y.o. female being seen today for the following concerns:  - Last seen by me @ [facility] ~ 2021  Mirena placed due to HMB and has not been seen since that time.  Do not have records through her MyChart- prior Pap and EMB May 2016.  Her biggest concern is weight gain  Denies any vaginal bleeding.  Denies pelvic or abdominal pain.  Denies hot flashes or night sweats.  Denies urinary concerns.  Reports no other acute concerns  Last pap: 2021 (outside facility)  No LMP recorded. (Menstrual status: IUD).  Review of Systems:   Pertinent items are noted in HPI Denies fever/chills, dizziness, headaches, visual disturbances, fatigue, shortness of breath, chest pain, abdominal pain, vomiting.  Pertinent History Reviewed:  History reviewed. No pertinent surgical history.  Past Medical History:  Diagnosis Date   Anemia    DUB (dysfunctional uterine bleeding)    Reviewed problem list, medications and allergies. Physical Assessment:   Vitals:   07/26/24 1104  BP: 132/81  Pulse: 99  Weight: 155 lb (70.3 kg)  Height: 5' 2 (1.575 m)  Body mass index is 28.35 kg/m.       Physical Examination:   General appearance: alert, well appearing, and in no distress  Psych: mood appropriate, normal affect  Skin: warm & dry   Cardiovascular: normal heart rate noted  Respiratory: normal respiratory effort, no distress  Abdomen: soft, non-tender, no rebound or guarding  Pelvic: VULVA: normal appearing vulva with no masses, tenderness or lesions, VAGINA: normal appearing vagina with normal color and discharge, no lesions, CERVIX: two small friable cervical polyp noted at cervical os, strings visualized. UTERUS/ADNEXA: normal sized uterus, no tenderness.  No adnexal masses or tenderness appreciated  Extremities: no edema     PROCEDURE: IUD removal, cervical polyp removal  Time out was performed.  A sterile speculum was placed in the vagina.  The cervix was visualized, and the strings visible. They were grasped and the Mirena  IUD was easily removed intact without complications. The patient tolerated the procedure well.   As above- two cervical polyps noted- verbal consent obtained.  Ring forceps were used to grasp the polypoid lesion- due to discomfort, not able to twist off polyp.  Tissler forceps were then used to remove the polyps.  Tissue sent to pathology for analysis.  Small bleeding was noted and hemostasis was achieved using silver nitrate sticks and Monsels.  The patient tolerated the procedure well.    Chaperone: Aleck Blase    Assessment & Plan:  1) Weight concerns - Discussed potential symptoms of menopause - Reviewed conservative management options - Patient wanting a pill, discussed that that would not be first choice and should she desire that route recommendation for weight loss clinic  2) IUD removal - Discussed that this may or may not be playing a role however the first step would be removal - Reviewed that based on age and current bleeding pattern, low suspicion for return of menses and suspect postmenopausal - Device removed without complications as above  3) Cervical polyps - Pap obtained as part of preventive screening guidelines -post-procedure instructions were given to the patient. The patient is to call with heavy bleeding, fever greater than 100.4, foul smelling vaginal discharge or other concerns.   No orders of the defined types  were placed in this encounter.   Return in about 1 year (around 07/26/2025) for Annual.   Portia Wisdom, DO Attending Obstetrician & Gynecologist, [facility] for [facility], [facility]    "

## 2024-07-30 ENCOUNTER — Ambulatory Visit: Payer: Self-pay | Admitting: Obstetrics & Gynecology

## 2024-07-30 ENCOUNTER — Encounter: Payer: Self-pay | Admitting: Obstetrics & Gynecology

## 2024-07-30 LAB — CYTOLOGY - PAP
Comment: NEGATIVE
Diagnosis: NEGATIVE
High risk HPV: NEGATIVE

## 2024-07-30 LAB — SURGICAL PATHOLOGY
